# Patient Record
Sex: Female | Born: 2000
Health system: Southern US, Community
[De-identification: ages and names within clinical notes are randomized; demographics above are authoritative.]

## PROBLEM LIST (undated history)

## (undated) DIAGNOSIS — J45909 Unspecified asthma, uncomplicated: Secondary | ICD-10-CM

## (undated) DIAGNOSIS — O24419 Gestational diabetes mellitus in pregnancy, unspecified control: Secondary | ICD-10-CM

## (undated) HISTORY — DX: Gestational diabetes mellitus in pregnancy, unspecified control: O24.419

## (undated) HISTORY — DX: Unspecified asthma, uncomplicated: J45.909

## (undated) HISTORY — PX: THROAT SURGERY: SHX803

---
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
## 2020-03-28 LAB — OB RESULTS CONSOLE GC/CHLAMYDIA
Chlamydia: NEGATIVE
Gonorrhea: NEGATIVE

## 2020-05-02 LAB — OB RESULTS CONSOLE RPR: RPR: NONREACTIVE

## 2020-05-02 LAB — OB RESULTS CONSOLE HEPATITIS B SURFACE ANTIGEN: Hepatitis B Surface Ag: NEGATIVE

## 2020-05-02 LAB — OB RESULTS CONSOLE RUBELLA ANTIBODY, IGM: Rubella: NON-IMMUNE/NOT IMMUNE

## 2020-05-02 LAB — HEPATITIS C ANTIBODY: HCV Ab: NEGATIVE

## 2020-05-02 LAB — OB RESULTS CONSOLE HIV ANTIBODY (ROUTINE TESTING): HIV: NONREACTIVE

## 2020-06-04 ENCOUNTER — Other Ambulatory Visit: Payer: Self-pay | Admitting: Obstetrics and Gynecology

## 2020-06-04 DIAGNOSIS — Z363 Encounter for antenatal screening for malformations: Secondary | ICD-10-CM

## 2020-06-25 ENCOUNTER — Ambulatory Visit: Payer: Medicaid Other | Attending: Obstetrics and Gynecology

## 2020-06-25 ENCOUNTER — Other Ambulatory Visit: Payer: Self-pay

## 2020-06-25 DIAGNOSIS — Z363 Encounter for antenatal screening for malformations: Secondary | ICD-10-CM

## 2020-06-26 ENCOUNTER — Other Ambulatory Visit: Payer: Self-pay | Admitting: Obstetrics

## 2020-06-26 DIAGNOSIS — Z362 Encounter for other antenatal screening follow-up: Secondary | ICD-10-CM

## 2020-06-26 DIAGNOSIS — O99332 Smoking (tobacco) complicating pregnancy, second trimester: Secondary | ICD-10-CM

## 2020-07-19 ENCOUNTER — Encounter: Payer: Self-pay | Admitting: *Deleted

## 2020-07-23 ENCOUNTER — Ambulatory Visit: Payer: Medicaid Other | Admitting: *Deleted

## 2020-07-23 ENCOUNTER — Ambulatory Visit: Payer: Medicaid Other | Attending: Obstetrics

## 2020-07-23 ENCOUNTER — Encounter: Payer: Self-pay | Admitting: *Deleted

## 2020-07-23 ENCOUNTER — Other Ambulatory Visit: Payer: Self-pay

## 2020-07-23 VITALS — BP 117/63 | HR 92

## 2020-07-23 DIAGNOSIS — F17290 Nicotine dependence, other tobacco product, uncomplicated: Secondary | ICD-10-CM

## 2020-07-23 DIAGNOSIS — O99332 Smoking (tobacco) complicating pregnancy, second trimester: Secondary | ICD-10-CM | POA: Diagnosis present

## 2020-07-23 DIAGNOSIS — Z3A23 23 weeks gestation of pregnancy: Secondary | ICD-10-CM

## 2020-07-23 DIAGNOSIS — O99512 Diseases of the respiratory system complicating pregnancy, second trimester: Secondary | ICD-10-CM

## 2020-07-23 DIAGNOSIS — O321XX0 Maternal care for breech presentation, not applicable or unspecified: Secondary | ICD-10-CM

## 2020-07-23 DIAGNOSIS — Z362 Encounter for other antenatal screening follow-up: Secondary | ICD-10-CM | POA: Insufficient documentation

## 2020-07-23 DIAGNOSIS — J45909 Unspecified asthma, uncomplicated: Secondary | ICD-10-CM

## 2020-07-23 DIAGNOSIS — Z3400 Encounter for supervision of normal first pregnancy, unspecified trimester: Secondary | ICD-10-CM

## 2020-08-21 LAB — OB RESULTS CONSOLE RPR: RPR: NONREACTIVE

## 2020-09-12 ENCOUNTER — Encounter: Payer: Medicaid Other | Attending: Obstetrics and Gynecology | Admitting: Registered"

## 2020-09-12 ENCOUNTER — Encounter: Payer: Self-pay | Admitting: Registered"

## 2020-09-12 ENCOUNTER — Other Ambulatory Visit: Payer: Self-pay

## 2020-09-12 DIAGNOSIS — O24419 Gestational diabetes mellitus in pregnancy, unspecified control: Secondary | ICD-10-CM | POA: Diagnosis not present

## 2020-09-12 HISTORY — DX: Gestational diabetes mellitus in pregnancy, unspecified control: O24.419

## 2020-09-12 NOTE — Progress Notes (Signed)
Patient was seen on 09/12/20 for Gestational Diabetes self-management class at [facility]. The following learning objectives were met by the patient during this course:   States the definition of Gestational Diabetes  States why dietary management is important in controlling blood glucose  Describes the effects each nutrient has on blood glucose levels  Demonstrates ability to create a balanced meal plan  Demonstrates carbohydrate counting   States when to check blood glucose levels  Demonstrates proper blood glucose monitoring techniques  States the effect of stress and exercise on blood glucose levels  States the importance of limiting caffeine and abstaining from alcohol and smoking  Blood glucose monitor given: Patient has meter but is not checking blood sugar prior to class Blood glucose reading: 95 mg/dL  Patient instructed to monitor glucose levels: FBS: 60 - <95; 1 hour: <140; 2 hour: <120  Patient received handouts:  Nutrition Diabetes and Pregnancy, including carb counting list  Patient will be seen for follow-up as needed.

## 2020-10-24 LAB — OB RESULTS CONSOLE GBS: GBS: NEGATIVE

## 2020-10-30 ENCOUNTER — Telehealth (HOSPITAL_COMMUNITY): Payer: Self-pay | Admitting: *Deleted

## 2020-10-30 ENCOUNTER — Encounter (HOSPITAL_COMMUNITY): Payer: Self-pay | Admitting: *Deleted

## 2020-10-30 NOTE — Telephone Encounter (Signed)
Preadmission screen  

## 2020-10-31 DIAGNOSIS — O321XX0 Maternal care for breech presentation, not applicable or unspecified: Secondary | ICD-10-CM | POA: Insufficient documentation

## 2020-11-02 ENCOUNTER — Encounter (HOSPITAL_COMMUNITY): Payer: Self-pay | Admitting: Obstetrics & Gynecology

## 2020-11-02 ENCOUNTER — Observation Stay (HOSPITAL_COMMUNITY)
Admission: AD | Admit: 2020-11-02 | Discharge: 2020-11-02 | Disposition: A | Discharge status: Home / Self Care | Payer: Medicaid Other | Attending: Obstetrics & Gynecology | Admitting: Obstetrics & Gynecology

## 2020-11-02 ENCOUNTER — Encounter (HOSPITAL_COMMUNITY): Payer: Self-pay | Admitting: Anesthesiology

## 2020-11-02 ENCOUNTER — Observation Stay (HOSPITAL_COMMUNITY)
Admission: RE | Admit: 2020-11-02 | Discharge: 2020-11-02 | Disposition: A | Discharge status: Home / Self Care | Payer: Medicaid Other | Source: Ambulatory Visit | Attending: Obstetrics & Gynecology | Admitting: Obstetrics & Gynecology

## 2020-11-02 DIAGNOSIS — J45909 Unspecified asthma, uncomplicated: Secondary | ICD-10-CM | POA: Diagnosis not present

## 2020-11-02 DIAGNOSIS — Z20822 Contact with and (suspected) exposure to covid-19: Secondary | ICD-10-CM | POA: Insufficient documentation

## 2020-11-02 DIAGNOSIS — O321XX0 Maternal care for breech presentation, not applicable or unspecified: Secondary | ICD-10-CM | POA: Diagnosis present

## 2020-11-02 DIAGNOSIS — O24113 Pre-existing diabetes mellitus, type 2, in pregnancy, third trimester: Secondary | ICD-10-CM | POA: Insufficient documentation

## 2020-11-02 DIAGNOSIS — Z3A37 37 weeks gestation of pregnancy: Secondary | ICD-10-CM | POA: Diagnosis not present

## 2020-11-02 DIAGNOSIS — Z87891 Personal history of nicotine dependence: Secondary | ICD-10-CM | POA: Insufficient documentation

## 2020-11-02 DIAGNOSIS — O98513 Other viral diseases complicating pregnancy, third trimester: Secondary | ICD-10-CM | POA: Insufficient documentation

## 2020-11-02 DIAGNOSIS — Z7984 Long term (current) use of oral hypoglycemic drugs: Secondary | ICD-10-CM | POA: Insufficient documentation

## 2020-11-02 DIAGNOSIS — B069 Rubella without complication: Secondary | ICD-10-CM | POA: Diagnosis not present

## 2020-11-02 DIAGNOSIS — O321XX1 Maternal care for breech presentation, fetus 1: Principal | ICD-10-CM | POA: Insufficient documentation

## 2020-11-02 LAB — RESP PANEL BY RT-PCR (FLU A&B, COVID) ARPGX2
Influenza A by PCR: NEGATIVE
Influenza B by PCR: NEGATIVE
SARS Coronavirus 2 by RT PCR: NEGATIVE

## 2020-11-02 LAB — CBC
HCT: 35.3 % — ABNORMAL LOW (ref 36.0–46.0)
Hemoglobin: 11.8 g/dL — ABNORMAL LOW (ref 12.0–15.0)
MCH: 27.6 pg (ref 26.0–34.0)
MCHC: 33.4 g/dL (ref 30.0–36.0)
MCV: 82.5 fL (ref 80.0–100.0)
Platelets: 188 10*3/uL (ref 150–400)
RBC: 4.28 MIL/uL (ref 3.87–5.11)
RDW: 13.8 % (ref 11.5–15.5)
WBC: 9.7 10*3/uL (ref 4.0–10.5)
nRBC: 0 % (ref 0.0–0.2)

## 2020-11-02 LAB — TYPE AND SCREEN
ABO/RH(D): O POS
Antibody Screen: NEGATIVE

## 2020-11-02 MED ORDER — TERBUTALINE SULFATE 1 MG/ML IJ SOLN
0.2500 mg | Freq: Once | INTRAMUSCULAR | Status: AC
  Administered 2020-11-02: 0.25 mg via SUBCUTANEOUS

## 2020-11-02 MED ORDER — SOD CITRATE-CITRIC ACID 500-334 MG/5ML PO SOLN: 30.0000 mL | ORAL | Status: DC

## 2020-11-02 MED ORDER — TERBUTALINE SULFATE 1 MG/ML IJ SOLN
INTRAMUSCULAR | Status: AC
  Filled 2020-11-02: qty 1

## 2020-11-02 MED ORDER — LACTATED RINGERS IV SOLN: INTRAVENOUS | Status: DC

## 2020-11-02 NOTE — Procedures (Signed)
Crystal Harper 20 y.o. G1P0 at [redacted]w[redacted]d  On arrival, reactive NST obtained. The risks, benefits and alternatives for external cephalic version were discussed and patient was consented. She was given terbutaline for uterine relaxation. A bedside US was performed which showed breech presentation and adequate fluid. Using manual pressure, the fetus was manipulated in a forward roll fashion, it was not successful so backward roll was attempted. Fetal heart tones were checked intermittently during the procedure and noted to be reassuring. The version was not successful, baby still in breech presentation. Placed on the monitoring, reassuring fetal status.  Patient was discharged following reactive monitoring with plan for scheduled CS [redacted]w[redacted]d-[redacted]w[redacted]d.  Joby Richart K Taam-Akelman 11/02/20 3:58 PM

## 2020-11-02 NOTE — H&P (Signed)
Crystal Harper is a 20 y.o. female G1P0 at [redacted]w[redacted]d presenting for scheduled ECV due to breech. She reports no LOF, VB, Contractions. Normal FM.   Pregnancy c/b: Breech GDMA2 on gylburide. Last scan 6/30 EFW 30.6% Rubella nonimmune  OB History     Gravida  1   Para      Term      Preterm      AB      Living         SAB      IAB      Ectopic      Multiple      Live Births             Past Medical History:  Diagnosis Date   Asthma    Childhood   Gestational diabetes    Gestational diabetes mellitus (GDM), antepartum 09/12/2020   Past Surgical History:  Procedure Laterality Date   THROAT SURGERY     WISDOM TOOTH EXTRACTION     Family History: family history is not on file. Social History:  reports that she has quit smoking. She has never used smokeless tobacco. She reports previous alcohol use. She reports previous drug use.     Maternal Diabetes: Yes:  Diabetes Type:  Insulin/Medication controlled Genetic Screening: Normal Panorama low risk Maternal Ultrasounds/Referrals: Normal Fetal Ultrasounds or other Referrals:  Referred to Materal Fetal Medicine  for anatomy scan Maternal Substance Abuse:  No Significant Maternal Medications:  None Significant Maternal Lab Results:  Group B Strep negative Other Comments:   GDMA1, Rubella nonimmune  Review of Systems Per HPI Exam Physical Exam    Blood pressure 124/80, pulse 86, temperature 98.2 F (36.8 C), temperature source Oral, resp. rate 18, height 5\' 3"  (1.6 m), weight 85.7 kg, last menstrual period 02/13/2020. Resting comfortably Gravid abdomen Lower extremity trace edema, no evidence of DVT Fetal testing: FHR 120, Cat 1. Toco quiet.  Prenatal labs: Rubella: Nonimmune (01/12 0000) RPR: Nonreactive (05/03 0000)  HBsAg: Negative (01/12 0000)  HIV: Non-reactive (01/12 0000)  GBS: Negative/-- (07/06 0000)   Assessment/Plan: Crystal Harper is a 20 y.o. female G1P0 [redacted]w[redacted]d admitted for  scheduled ECV ECV: confirmed breech on admission. Discussed ECV, including risks/benefits/alternative of primary CS. Patient signed consent. Rh positive.   Crystal Harper 11/02/2020, 8:32 AM

## 2020-11-05 ENCOUNTER — Telehealth (HOSPITAL_COMMUNITY): Payer: Self-pay | Admitting: *Deleted

## 2020-11-05 NOTE — Patient Instructions (Signed)
Crystal Harper  11/05/2020   Your procedure is scheduled on:  11/14/2020  Arrive at 46 at Entrance C on [address] at [facility]  and [employer]. You are invited to use the FREE valet parking or use the Visitor's parking deck.  Pick up the phone at the desk and dial [phone].  Call this number if you have problems the morning of surgery: [phone]  Remember:   Do not eat food:(After Midnight) Desps de medianoche.  Do not drink clear liquids: (After Midnight) Desps de medianoche.  Take these medicines the morning of surgery with A SIP OF WATER:  none   Do not wear jewelry, make-up or nail polish.  Do not wear lotions, powders, or perfumes. Do not wear deodorant.  Do not shave 48 hours prior to surgery.  Do not bring valuables to the hospital.  [facility] is not   responsible for any belongings or valuables brought to the hospital.  Contacts, dentures or bridgework may not be worn into surgery.  Leave suitcase in the car. After surgery it may be brought to your room.  For patients admitted to the hospital, checkout time is 11:00 AM the day of              discharge.      Please read over the following fact sheets that you were given:     Preparing for Surgery

## 2020-11-05 NOTE — Telephone Encounter (Signed)
Preadmission screen  

## 2020-11-07 ENCOUNTER — Encounter (HOSPITAL_COMMUNITY): Payer: Self-pay

## 2020-11-07 NOTE — Discharge Summary (Signed)
Physician Discharge Summary  Patient ID: Crystal Harper MRN: [id number] DOB/AGE: [birth year] 20 y.o.  Admit date: 11/02/2020 Discharge date: 11/02/2020   Admission Diagnoses: breech, pregnancy at [redacted]w[redacted]d    Discharge Diagnoses: breech, pregnancy at [redacted]w[redacted]d    Discharged Condition: good   Hospital Course:  The patient was admitted through pre-op holding where her consent was reviewed and all questions were answered. External cephalic version was attempted, it was not successful and baby remained in the breech position. Fetal status was reassuring. Patient was discharged in stable condition.   Treatments: External cephalic version  Discharge Exam: Blood pressure 124/80, pulse 86, temperature 98.2 F (36.8 C), temperature source Oral, resp. rate 16, height 5\' 3"  (1.6 m), weight 85.7 kg, last menstrual period 02/13/2020. Resting comfortably   Disposition: Discharge disposition: 01-Home or Self Care        Allergies as of 11/02/2020   No Known Allergies      Medication List     ASK your doctor about these medications    PRENATAL VITAMINS PO Take by mouth.         Signed: Jonelle Sidle 11/07/2020, 9:17 AM

## 2020-11-08 ENCOUNTER — Encounter (HOSPITAL_COMMUNITY)
Admission: AD | Disposition: A | Discharge status: Home / Self Care | Payer: Self-pay | Source: Home / Self Care | Attending: Obstetrics & Gynecology

## 2020-11-08 ENCOUNTER — Encounter (HOSPITAL_COMMUNITY): Payer: Self-pay | Admitting: Obstetrics & Gynecology

## 2020-11-08 ENCOUNTER — Inpatient Hospital Stay (HOSPITAL_COMMUNITY)
Admission: AD | Admit: 2020-11-08 | Discharge: 2020-11-10 | DRG: 788 | Disposition: A | Discharge status: Home / Self Care | Payer: Medicaid Other | Attending: Obstetrics & Gynecology | Admitting: Obstetrics & Gynecology

## 2020-11-08 ENCOUNTER — Inpatient Hospital Stay (HOSPITAL_COMMUNITY): Payer: Medicaid Other | Admitting: Certified Registered Nurse Anesthetist

## 2020-11-08 ENCOUNTER — Other Ambulatory Visit: Payer: Self-pay

## 2020-11-08 DIAGNOSIS — Z3A38 38 weeks gestation of pregnancy: Secondary | ICD-10-CM | POA: Diagnosis not present

## 2020-11-08 DIAGNOSIS — O3413 Maternal care for benign tumor of corpus uteri, third trimester: Secondary | ICD-10-CM | POA: Diagnosis present

## 2020-11-08 DIAGNOSIS — O321XX0 Maternal care for breech presentation, not applicable or unspecified: Secondary | ICD-10-CM | POA: Diagnosis present

## 2020-11-08 DIAGNOSIS — O24425 Gestational diabetes mellitus in childbirth, controlled by oral hypoglycemic drugs: Secondary | ICD-10-CM | POA: Diagnosis present

## 2020-11-08 DIAGNOSIS — D259 Leiomyoma of uterus, unspecified: Secondary | ICD-10-CM | POA: Diagnosis present

## 2020-11-08 DIAGNOSIS — Z87891 Personal history of nicotine dependence: Secondary | ICD-10-CM

## 2020-11-08 DIAGNOSIS — O9081 Anemia of the puerperium: Secondary | ICD-10-CM | POA: Diagnosis not present

## 2020-11-08 DIAGNOSIS — O26893 Other specified pregnancy related conditions, third trimester: Secondary | ICD-10-CM | POA: Diagnosis present

## 2020-11-08 LAB — GLUCOSE, CAPILLARY: Glucose-Capillary: 106 mg/dL — ABNORMAL HIGH (ref 70–99)

## 2020-11-08 SURGERY — Surgical Case
Anesthesia: Regional | Wound class: Clean Contaminated

## 2020-11-08 MED ORDER — PROPOFOL 10 MG/ML IV BOLUS
INTRAVENOUS | Status: AC
  Filled 2020-11-08: qty 20

## 2020-11-08 MED ORDER — SOD CITRATE-CITRIC ACID 500-334 MG/5ML PO SOLN
ORAL | Status: AC
  Filled 2020-11-08: qty 30

## 2020-11-08 MED ORDER — KETOROLAC TROMETHAMINE 30 MG/ML IJ SOLN
30.0000 mg | Freq: Four times a day (QID) | INTRAMUSCULAR | Status: AC
  Administered 2020-11-08 – 2020-11-09 (×4): 30 mg via INTRAVENOUS
  Filled 2020-11-08 (×4): qty 1

## 2020-11-08 MED ORDER — LACTATED RINGERS IV SOLN: INTRAVENOUS | Status: DC

## 2020-11-08 MED ORDER — MIDAZOLAM HCL 2 MG/2ML IJ SOLN
INTRAMUSCULAR | Status: DC | PRN
  Administered 2020-11-08: 2 mg via INTRAVENOUS

## 2020-11-08 MED ORDER — HYDROMORPHONE HCL 1 MG/ML IJ SOLN
INTRAMUSCULAR | Status: AC
  Filled 2020-11-08: qty 0.5

## 2020-11-08 MED ORDER — FLEET ENEMA 7-19 GM/118ML RE ENEM: 1.0000 | ENEMA | Freq: Every day | RECTAL | Status: DC | PRN

## 2020-11-08 MED ORDER — DIPHENHYDRAMINE HCL 25 MG PO CAPS: 25.0000 mg | ORAL_CAPSULE | Freq: Four times a day (QID) | ORAL | Status: DC | PRN

## 2020-11-08 MED ORDER — SUCCINYLCHOLINE CHLORIDE 200 MG/10ML IV SOSY
PREFILLED_SYRINGE | INTRAVENOUS | Status: AC
  Filled 2020-11-08: qty 10

## 2020-11-08 MED ORDER — SUCCINYLCHOLINE CHLORIDE 20 MG/ML IJ SOLN
INTRAMUSCULAR | Status: DC | PRN
  Administered 2020-11-08: 120 mg via INTRAVENOUS

## 2020-11-08 MED ORDER — COCONUT OIL OIL: 1.0000 "application " | TOPICAL_OIL | Status: DC | PRN

## 2020-11-08 MED ORDER — ROCURONIUM BROMIDE 10 MG/ML (PF) SYRINGE
PREFILLED_SYRINGE | INTRAVENOUS | Status: AC
  Filled 2020-11-08: qty 10

## 2020-11-08 MED ORDER — SENNOSIDES-DOCUSATE SODIUM 8.6-50 MG PO TABS
2.0000 | ORAL_TABLET | ORAL | Status: DC
  Administered 2020-11-08 – 2020-11-09 (×2): 2 via ORAL
  Filled 2020-11-08 (×2): qty 2

## 2020-11-08 MED ORDER — IBUPROFEN 600 MG PO TABS
600.0000 mg | ORAL_TABLET | Freq: Four times a day (QID) | ORAL | Status: DC
  Administered 2020-11-09 – 2020-11-10 (×3): 600 mg via ORAL
  Filled 2020-11-08 (×3): qty 1

## 2020-11-08 MED ORDER — BISACODYL 10 MG RE SUPP: 10.0000 mg | Freq: Every day | RECTAL | Status: DC | PRN

## 2020-11-08 MED ORDER — ARTIFICIAL TEARS OPHTHALMIC OINT
TOPICAL_OINTMENT | OPHTHALMIC | Status: AC
  Filled 2020-11-08: qty 3.5

## 2020-11-08 MED ORDER — PROPOFOL 10 MG/ML IV BOLUS
INTRAVENOUS | Status: DC | PRN
  Administered 2020-11-08: 200 mg via INTRAVENOUS

## 2020-11-08 MED ORDER — OXYTOCIN-SODIUM CHLORIDE 30-0.9 UT/500ML-% IV SOLN
INTRAVENOUS | Status: DC | PRN
  Administered 2020-11-08: 1300 mL/h via INTRAVENOUS

## 2020-11-08 MED ORDER — MENTHOL 3 MG MT LOZG: 1.0000 | LOZENGE | OROMUCOSAL | Status: DC | PRN

## 2020-11-08 MED ORDER — HYDROMORPHONE HCL 1 MG/ML IJ SOLN
0.2500 mg | INTRAMUSCULAR | Status: DC | PRN
  Administered 2020-11-08: 0.5 mg via INTRAVENOUS

## 2020-11-08 MED ORDER — SODIUM CHLORIDE (PF) 0.9 % IJ SOLN
INTRAMUSCULAR | Status: AC
  Filled 2020-11-08: qty 10

## 2020-11-08 MED ORDER — PROMETHAZINE HCL 25 MG/ML IJ SOLN: 6.2500 mg | INTRAMUSCULAR | Status: DC | PRN

## 2020-11-08 MED ORDER — HYDROMORPHONE HCL 1 MG/ML IJ SOLN
0.2500 mg | INTRAMUSCULAR | Status: DC | PRN
  Administered 2020-11-08 (×3): 0.5 mg via INTRAVENOUS

## 2020-11-08 MED ORDER — ACETAMINOPHEN 500 MG PO TABS
1000.0000 mg | ORAL_TABLET | Freq: Four times a day (QID) | ORAL | Status: DC
  Administered 2020-11-08 – 2020-11-10 (×7): 1000 mg via ORAL
  Filled 2020-11-08 (×8): qty 2

## 2020-11-08 MED ORDER — FENTANYL CITRATE (PF) 100 MCG/2ML IJ SOLN
INTRAMUSCULAR | Status: DC | PRN
  Administered 2020-11-08: 250 ug via INTRAVENOUS

## 2020-11-08 MED ORDER — NEOSTIGMINE METHYLSULFATE 3 MG/3ML IV SOSY
PREFILLED_SYRINGE | INTRAVENOUS | Status: AC
  Filled 2020-11-08: qty 3

## 2020-11-08 MED ORDER — STERILE WATER FOR IRRIGATION IR SOLN
Status: DC | PRN
Start: 2020-11-08 — End: 2020-11-08
  Administered 2020-11-08: 1000 mL

## 2020-11-08 MED ORDER — PHENYLEPHRINE 40 MCG/ML (10ML) SYRINGE FOR IV PUSH (FOR BLOOD PRESSURE SUPPORT)
PREFILLED_SYRINGE | INTRAVENOUS | Status: AC
  Filled 2020-11-08: qty 10

## 2020-11-08 MED ORDER — MIDAZOLAM HCL 2 MG/2ML IJ SOLN
INTRAMUSCULAR | Status: AC
  Filled 2020-11-08: qty 2

## 2020-11-08 MED ORDER — CEFAZOLIN SODIUM-DEXTROSE 2-3 GM-%(50ML) IV SOLR
INTRAVENOUS | Status: DC | PRN
  Administered 2020-11-08: 2 g via INTRAVENOUS

## 2020-11-08 MED ORDER — PRENATAL MULTIVITAMIN CH
1.0000 | ORAL_TABLET | Freq: Every day | ORAL | Status: DC
  Administered 2020-11-08 – 2020-11-09 (×2): 1 via ORAL
  Filled 2020-11-08 (×2): qty 1

## 2020-11-08 MED ORDER — ZOLPIDEM TARTRATE 5 MG PO TABS: 5.0000 mg | ORAL_TABLET | Freq: Every evening | ORAL | Status: DC | PRN

## 2020-11-08 MED ORDER — DROPERIDOL 2.5 MG/ML IJ SOLN: 0.6250 mg | Freq: Once | INTRAMUSCULAR | Status: DC | PRN

## 2020-11-08 MED ORDER — OXYCODONE HCL 5 MG PO TABS: 5.0000 mg | ORAL_TABLET | Freq: Once | ORAL | Status: DC | PRN

## 2020-11-08 MED ORDER — DIBUCAINE (PERIANAL) 1 % EX OINT: 1.0000 "application " | TOPICAL_OINTMENT | CUTANEOUS | Status: DC | PRN

## 2020-11-08 MED ORDER — WITCH HAZEL-GLYCERIN EX PADS: 1.0000 "application " | MEDICATED_PAD | CUTANEOUS | Status: DC | PRN

## 2020-11-08 MED ORDER — DEXAMETHASONE SODIUM PHOSPHATE 10 MG/ML IJ SOLN
INTRAMUSCULAR | Status: AC
  Filled 2020-11-08: qty 1

## 2020-11-08 MED ORDER — ONDANSETRON HCL 4 MG/2ML IJ SOLN
INTRAMUSCULAR | Status: AC
  Filled 2020-11-08: qty 2

## 2020-11-08 MED ORDER — FENTANYL CITRATE (PF) 250 MCG/5ML IJ SOLN
INTRAMUSCULAR | Status: AC
  Filled 2020-11-08: qty 5

## 2020-11-08 MED ORDER — ONDANSETRON HCL 4 MG/2ML IJ SOLN
INTRAMUSCULAR | Status: DC | PRN
  Administered 2020-11-08: 4 mg via INTRAVENOUS

## 2020-11-08 MED ORDER — KETOROLAC TROMETHAMINE 30 MG/ML IJ SOLN: 30.0000 mg | Freq: Four times a day (QID) | INTRAMUSCULAR | Status: DC

## 2020-11-08 MED ORDER — ACETAMINOPHEN 10 MG/ML IV SOLN
INTRAVENOUS | Status: AC
  Filled 2020-11-08: qty 100

## 2020-11-08 MED ORDER — TRANEXAMIC ACID-NACL 1000-0.7 MG/100ML-% IV SOLN
INTRAVENOUS | Status: DC | PRN
  Administered 2020-11-08: 1000 mg via INTRAVENOUS

## 2020-11-08 MED ORDER — OXYTOCIN-SODIUM CHLORIDE 30-0.9 UT/500ML-% IV SOLN: 2.5000 [IU]/h | INTRAVENOUS | Status: AC

## 2020-11-08 MED ORDER — IBUPROFEN 600 MG PO TABS: 600.0000 mg | ORAL_TABLET | Freq: Four times a day (QID) | ORAL | Status: DC

## 2020-11-08 MED ORDER — ACETAMINOPHEN 10 MG/ML IV SOLN
INTRAVENOUS | Status: DC | PRN
  Administered 2020-11-08: 1000 mg via INTRAVENOUS

## 2020-11-08 MED ORDER — GLYCOPYRROLATE PF 0.2 MG/ML IJ SOSY
PREFILLED_SYRINGE | INTRAMUSCULAR | Status: AC
  Filled 2020-11-08: qty 1

## 2020-11-08 MED ORDER — DEXAMETHASONE SODIUM PHOSPHATE 10 MG/ML IJ SOLN
INTRAMUSCULAR | Status: DC | PRN
  Administered 2020-11-08: 10 mg via INTRAVENOUS

## 2020-11-08 MED ORDER — SIMETHICONE 80 MG PO CHEW: 80.0000 mg | CHEWABLE_TABLET | ORAL | Status: DC | PRN

## 2020-11-08 MED ORDER — SODIUM CHLORIDE 0.9 % IR SOLN
Status: DC | PRN
Start: 2020-11-08 — End: 2020-11-08
  Administered 2020-11-08: 1000 mL

## 2020-11-08 MED ORDER — OXYCODONE HCL 5 MG PO TABS
5.0000 mg | ORAL_TABLET | ORAL | Status: DC | PRN
  Administered 2020-11-09: 10 mg via ORAL
  Administered 2020-11-09: 5 mg via ORAL
  Administered 2020-11-09 – 2020-11-10 (×2): 10 mg via ORAL
  Filled 2020-11-08 (×3): qty 2
  Filled 2020-11-08: qty 1

## 2020-11-08 MED ORDER — SIMETHICONE 80 MG PO CHEW
80.0000 mg | CHEWABLE_TABLET | Freq: Three times a day (TID) | ORAL | Status: DC
  Administered 2020-11-08 – 2020-11-10 (×7): 80 mg via ORAL
  Filled 2020-11-08 (×7): qty 1

## 2020-11-08 MED ORDER — HYDROMORPHONE HCL 1 MG/ML IJ SOLN
0.2000 mg | INTRAMUSCULAR | Status: DC | PRN
  Administered 2020-11-08: 0.6 mg via INTRAVENOUS
  Filled 2020-11-08: qty 1

## 2020-11-08 MED ORDER — OXYTOCIN-SODIUM CHLORIDE 30-0.9 UT/500ML-% IV SOLN
INTRAVENOUS | Status: AC
  Filled 2020-11-08: qty 500

## 2020-11-08 MED ORDER — LACTATED RINGERS IV SOLN: INTRAVENOUS | Status: DC | PRN

## 2020-11-08 MED ORDER — ACETAMINOPHEN 10 MG/ML IV SOLN: 1000.0000 mg | Freq: Once | INTRAVENOUS | Status: DC | PRN

## 2020-11-08 MED ORDER — LIDOCAINE 2% (20 MG/ML) 5 ML SYRINGE
INTRAMUSCULAR | Status: AC
  Filled 2020-11-08: qty 5

## 2020-11-08 MED ORDER — KETOROLAC TROMETHAMINE 30 MG/ML IJ SOLN
30.0000 mg | Freq: Once | INTRAMUSCULAR | Status: AC | PRN
  Administered 2020-11-08: 30 mg via INTRAVENOUS

## 2020-11-08 MED ORDER — OXYCODONE HCL 5 MG/5ML PO SOLN: 5.0000 mg | Freq: Once | ORAL | Status: DC | PRN

## 2020-11-08 MED ORDER — KETOROLAC TROMETHAMINE 30 MG/ML IJ SOLN
INTRAMUSCULAR | Status: AC
  Filled 2020-11-08: qty 1

## 2020-11-08 MED ORDER — ONDANSETRON HCL 4 MG/2ML IJ SOLN: 4.0000 mg | Freq: Once | INTRAMUSCULAR | Status: DC | PRN

## 2020-11-08 SURGICAL SUPPLY — 34 items
BENZOIN TINCTURE PRP APPL 2/3 (GAUZE/BANDAGES/DRESSINGS) ×2 IMPLANT
CHLORAPREP W/TINT 26ML (MISCELLANEOUS) ×2 IMPLANT
CLAMP CORD UMBIL (MISCELLANEOUS) IMPLANT
CLOSURE STERI STRIP 1/2 X4 (GAUZE/BANDAGES/DRESSINGS) ×2 IMPLANT
CLOTH BEACON ORANGE TIMEOUT ST (SAFETY) ×2 IMPLANT
DRSG OPSITE POSTOP 4X10 (GAUZE/BANDAGES/DRESSINGS) ×2 IMPLANT
ELECT REM PT RETURN 9FT ADLT (ELECTROSURGICAL) ×2
ELECTRODE REM PT RTRN 9FT ADLT (ELECTROSURGICAL) ×1 IMPLANT
EXTRACTOR VACUUM KIWI (MISCELLANEOUS) IMPLANT
GLOVE BIO SURGEON STRL SZ 6 (GLOVE) ×2 IMPLANT
GLOVE BIOGEL PI IND STRL 6 (GLOVE) ×1 IMPLANT
GLOVE BIOGEL PI IND STRL 7.0 (GLOVE) ×1 IMPLANT
GLOVE BIOGEL PI INDICATOR 6 (GLOVE) ×1
GLOVE BIOGEL PI INDICATOR 7.0 (GLOVE) ×1
GOWN STRL REUS W/TWL LRG LVL3 (GOWN DISPOSABLE) ×4 IMPLANT
KIT ABG SYR 3ML LUER SLIP (SYRINGE) IMPLANT
NEEDLE HYPO 25X5/8 SAFETYGLIDE (NEEDLE) IMPLANT
NS IRRIG 1000ML POUR BTL (IV SOLUTION) ×2 IMPLANT
PACK C SECTION WH (CUSTOM PROCEDURE TRAY) ×2 IMPLANT
PAD OB MATERNITY 4.3X12.25 (PERSONAL CARE ITEMS) ×2 IMPLANT
PENCIL SMOKE EVAC W/HOLSTER (ELECTROSURGICAL) ×2 IMPLANT
RTRCTR C-SECT PINK 25CM LRG (MISCELLANEOUS) ×2 IMPLANT
STRIP CLOSURE SKIN 1/2X4 (GAUZE/BANDAGES/DRESSINGS) ×2 IMPLANT
SUT MNCRL 0 VIOLET CTX 36 (SUTURE) ×1 IMPLANT
SUT MNCRL AB 4-0 PS2 18 (SUTURE) ×2 IMPLANT
SUT MONOCRYL 0 CTX 36 (SUTURE) ×2
SUT PLAIN 0 NONE (SUTURE) IMPLANT
SUT VIC AB 0 CTX 36 (SUTURE) ×4
SUT VIC AB 0 CTX36XBRD ANBCTRL (SUTURE) ×2 IMPLANT
SUT VIC AB 2-0 CT1 27 (SUTURE) ×4
SUT VIC AB 2-0 CT1 TAPERPNT 27 (SUTURE) ×2 IMPLANT
TOWEL OR 17X24 6PK STRL BLUE (TOWEL DISPOSABLE) ×2 IMPLANT
TRAY FOLEY W/BAG SLVR 14FR LF (SET/KITS/TRAYS/PACK) ×2 IMPLANT
WATER STERILE IRR 1000ML POUR (IV SOLUTION) ×2 IMPLANT

## 2020-11-08 NOTE — MAU Note (Signed)
Leaking cl fld since 0430. Having ctxs. 2-3 last sve. Breech. Good FM

## 2020-11-08 NOTE — Op Note (Signed)
Crystal Harper PROCEDURE DATE: 11/08/2020  PREOPERATIVE DIAGNOSES: Intrauterine pregnancy at [redacted]w[redacted]d weeks gestation;  cord prolapse, breech presentation  POSTOPERATIVE DIAGNOSES: The same  PROCEDURE: Primary Low Transverse Cesarean Section  SURGEON:  Dr. Shellia Cleverly  ASSISTANT:  Dr. Corliss Blacker  ANESTHESIOLOGY TEAM: Anesthesiologist: Josephine Igo, MD CRNA: Sandrea Matte, CRNA; Hewitt Blade, CRNA; Ignacia Bayley, CRNA; Vista Deck, CRNA  ANESTHESIA: General  INTRAVENOUS FLUIDS: 1700 ml   ESTIMATED BLOOD LOSS: 307 ml URINE OUTPUT:  200 ml SPECIMENS: Placenta sent to pathology COMPLICATIONS: None immediate  Due to emergency situation, a full written consent process was not obtained. The risks, benefits, complications, treatment options, and expected outcomes were discussed with the patient while moving to the OR.   The patient concurred with the proposed plan, giving verbal informed consent.     FINDINGS:  Viable female infant in breech presentation.  Apgars 1, 3 and 5.  Clear amniotic fluid.  Intact placenta, three vessel cord.  Normal fallopian tubes and ovaries bilaterally. 4x4cm fibroid noted on uterine fundus.  PROCEDURE IN DETAIL:  The patient was urgently taken to the the operating room and general anesthesia was administered. She was then placed in a dorsal supine position with a leftward tilt, prepped quickly with betadine and draped in a sterile manner. After a timeout was performed, a Pfannenstiel skin incision was made with scalpel and carried through to the underlying layer of fascia. The fascial incision was extended bilaterally in a blunt fashion.  The fascia was separated from underlying rectus muscles bluntly.  The rectus muscles were separated in the midline bluntly and the peritoneum was entered bluntly. Attention was turned to the lower uterine segment where a low transverse hysterotomy was made with a scalpel and extended bilaterally  bluntly.  The infant was successfully delivered from breech presentation, the cord was clamped and cut and the infant was handed over to awaiting neonatology team immediately. Arterial blood gas sent. Incision to delivery time was less than two minutes.  The placenta was delivered intact and had a three-vessel cord. The uterus was then cleared of clot and debris.  The hysterotomy was closed with 0 Monocryl in a running locked fashion, and an imbricating layer was also placed with 0 Monocryl. The pelvis was cleared of all clot and debris. Hemostasis was confirmed on all surfaces. The fascia was then closed using 0 Vicryl in a running fashion.  The subcutaneous layer was irrigated and closed with plain gut and the skin was closed with a 4-0 Vicryl subcuticular stitch. The patient tolerated the procedure well. Sponge, instrument and needle counts were correct x 3.  She was taken to the recovery room in stable condition.   Arrie Senate, MD OB Fellow, [place] for [place] 11/08/2020 7:25 AM

## 2020-11-08 NOTE — Progress Notes (Signed)
Subjective: Postpartum Day 0: Cesarean Delivery, S/P stat C/S under general anesthesia for cord prolapse Patient reports pain controlled, denies nausea or vomiting.   Objective: Vital signs in last 24 hours: Temp:  [97 F (36.1 C)-97.9 F (36.6 C)] 97.7 F (36.5 C) (07/21 1515) Pulse Rate:  [62-89] 75 (07/21 1515) Resp:  [0-26] 16 (07/21 1515) BP: (114-133)/(68-92) 132/73 (07/21 1515) SpO2:  [95 %-100 %] 100 % (07/21 1515) Weight:  [85.3 kg] 85.3 kg (07/21 0618)  Physical Exam:  General: alert, cooperative, and appears stated age Lochia: appropriate Uterine Fundus: firm Incision: healing well DVT Evaluation: No evidence of DVT seen on physical exam.  No results for input(s): HGB, HCT in the last 72 hours.  Assessment/Plan: Status post Cesarean section. Doing well postoperatively.  Continue current care. Baby in [place] 11/08/2020, 4:59 PM

## 2020-11-08 NOTE — OB Triage Provider Note (Signed)
I was called to the room for patient who was breech, leaking fluid, and having painful contractions. As I entered the room, there was a deceleration on the FHR tracing and I put on a sterile glove to check the cervix.  Umbilical cord was visible at introitus prior to exam.  I pushed the presenting part off of the cord with my gloved hand and stated that we had a cord prolapse and needed to activate a Code Cesarean. Pt transported to OR and I remained on stretcher, with gloved hand holding presenting part off of cord as much as possible.  See OP report for delivery details.

## 2020-11-08 NOTE — Anesthesia Postprocedure Evaluation (Signed)
Anesthesia Post Note  Patient: Crystal Harper  Procedure(s) Performed: CESAREAN SECTION     Patient location during evaluation: PACU Level of consciousness: awake and alert Pain management: pain level controlled Vital Signs Assessment: post-procedure vital signs reviewed and stable Respiratory status: spontaneous breathing, nonlabored ventilation and respiratory function stable Cardiovascular status: blood pressure returned to baseline and stable Postop Assessment: no apparent nausea or vomiting Anesthetic complications: no   No notable events documented.  Last Vitals:  Vitals:   11/08/20 0830 11/08/20 0839  BP: (!) 118/92 126/83  Pulse: 68 63  Resp: 19 17  Temp:  (!) 36.1 C  SpO2:  98%    Last Pain:  Vitals:   11/08/20 0839  TempSrc: Axillary  PainSc:    Pain Goal: Patients Stated Pain Goal: 0 (11/08/20 7096)              Epidural/Spinal Function Cutaneous sensation: Normal sensation (11/08/20 0830), Patient able to flex knees: Yes (11/08/20 0830), Patient able to lift hips off bed: No (11/08/20 0830), Back pain beyond tenderness at insertion site: No (11/08/20 0830), Progressively worsening motor and/or sensory loss: No (11/08/20 0830), Bowel and/or bladder incontinence post epidural: No (11/08/20 0830)  Lidia Collum

## 2020-11-08 NOTE — Anesthesia Preprocedure Evaluation (Addendum)
Anesthesia Evaluation   Patient awake  Preop documentation limited or incomplete due to emergent nature of procedure.  History of Anesthesia Complications Negative for: history of anesthetic complications  Airway        Dental   Pulmonary former smoker,           Cardiovascular      Neuro/Psych    GI/Hepatic   Endo/Other  diabetes  Renal/GU      Musculoskeletal   Abdominal   Peds  Hematology        Component                Value               Date/Time                 HGB                      11.8 (L)            11/02/2020 0808           HGB                      14.2                12/05/2019 1102           HCT                      35.3 (L)            11/02/2020 0808           HCT                      40.5                12/05/2019 1102           PLT                      188                 11/02/2020 0808           PLT                      247                 12/05/2019 1102        Anesthesia Other Findings Cord prolapse- to OR STAT for C/S under GA  Reproductive/Obstetrics (+) Pregnancy G1P1001 at [redacted]w[redacted]d                            Anesthesia Physical Anesthesia Plan  ASA: 3 and emergent  Anesthesia Plan: General ETT, Rapid Sequence and Cricoid Pressure   Post-op Pain Management:    Induction:   PONV Risk Score and Plan: Treatment may vary due to age or medical condition, Ondansetron, Dexamethasone and Midazolam  Airway Management Planned:   Additional Equipment:   Intra-op Plan:   Post-operative Plan:   Informed Consent:     Only emergency history available  Plan Discussed with:   Anesthesia Plan Comments:         Anesthesia Quick Evaluation

## 2020-11-08 NOTE — H&P (Signed)
Crystal Harper is a 20 y.o. female G1P0 35w3dpresented to MAU for leakage of fluid, contractions.  Stat CS was called from the MAU due to prolapsed cord.   Pregnancy c/b: Breech GDMA2 on gylburide. Last scan 6/30 EFW 30.6% Rubella nonimmune  OB History     Gravida  1   Para      Term      Preterm      AB      Living         SAB      IAB      Ectopic      Multiple      Live Births             Past Medical History:  Diagnosis Date   Asthma    Childhood   Gestational diabetes    Gestational diabetes mellitus (GDM), antepartum 09/12/2020   Past Surgical History:  Procedure Laterality Date   THROAT SURGERY     WISDOM TOOTH EXTRACTION     Family History: family history is not on file. Social History:  reports that she has quit smoking. She has never used smokeless tobacco. She reports previous alcohol use. She reports previous drug use.     Maternal Diabetes: Yes:  Diabetes Type:  Insulin/Medication controlled Genetic Screening: Normal Panorama low risk Maternal Ultrasounds/Referrals: Normal Fetal Ultrasounds or other Referrals:  Referred to Materal Fetal Medicine  for anatomy scan Maternal Substance Abuse:  No Significant Maternal Medications:  None Significant Maternal Lab Results:  Group B Strep negative Other Comments:   GDMA1, Rubella nonimmune  Review of Systems Per HPI Exam Physical Exam    Blood pressure 131/74, pulse (P) 89, temperature 97.9 F (36.6 C), resp. rate (P) 18, height _0  (1.6 m), weight 85.3 kg, last menstrual period 02/13/2020, SpO2 (P) 100 %. Per MAU, unable to examine patient prior to OR Prenatal labs: ABO, Rh:  --/--/O POS (07/15 02023 Antibody: NEG (07/15 03435 Rubella: Nonimmune (01/12 0000) RPR: Nonreactive (05/03 0000)  HBsAg: Negative (01/12 0000)  HIV: Non-reactive (01/12 0000)  GBS: Negative/-- (07/06 0000)   Chemistry Hematology Recent Labs  Lab 11/02/20 0808  WBC 9.7  RBC 4.28  HGB 11.8*   HCT 35.3*  MCV 82.5  MCH 27.6  MCHC 33.4  RDW 13.8  PLT 188     Assessment/Plan: Crystal Harper is a 20y.o. female G1P0 34w3ddmitted for stat CS due to cord prolapse insetting of SROM/labor, breech.  Stat CS: Initial care by MAU, I was notified about patient and CS after baby was delivered by Dr. ErRip Harbournd Dr. FiSylvester HarderSee op note for details GDMA2: was on gylburide, plan fasting PPD#1 BG Rubella nonimmune: MMR PP  Crystal Harper 11/08/2020, 7:42 AM

## 2020-11-08 NOTE — Transfer of Care (Signed)
Immediate Anesthesia Transfer of Care Note  Patient: Crystal Harper  Procedure(s) Performed: CESAREAN SECTION  Patient Location: PACU  Anesthesia Type:General  Level of Consciousness: awake and patient cooperative  Airway & Oxygen Therapy: Patient Spontanous Breathing  Post-op Assessment: Report given to RN and Post -op Vital signs reviewed and stable  Post vital signs: Reviewed and stable  Last Vitals:  Vitals Value Taken Time  BP 131/74 11/08/20 0732  Temp    Pulse 83 11/08/20 0739  Resp 18 11/08/20 0739  SpO2 100 % 11/08/20 0739  Vitals shown include unvalidated device data.  Last Pain:  Vitals:   11/08/20 0618  PainSc: 9       Patients Stated Pain Goal: 0 (88/91/69 4503)  Complications: No notable events documented.

## 2020-11-08 NOTE — Anesthesia Procedure Notes (Addendum)
Procedure Name: Intubation Date/Time: 11/08/2020 6:58 AM Performed by: Sandrea Matte, CRNA Pre-anesthesia Checklist: Patient identified, Emergency Drugs available, Suction available and Patient being monitored Patient Re-evaluated:Patient Re-evaluated prior to induction Oxygen Delivery Method: Circle system utilized Preoxygenation: Pre-oxygenation with 100% oxygen Induction Type: IV induction, Rapid sequence and Cricoid Pressure applied Ventilation: Mask ventilation without difficulty Laryngoscope Size: Mac and 4 Grade View: Grade I Tube type: Oral Tube size: 7.0 mm Number of attempts: 1 Airway Equipment and Method: Stylet and Oral airway Placement Confirmation: ETT inserted through vocal cords under direct vision, positive ETCO2 and breath sounds checked- equal and bilateral Secured at: 21 cm Tube secured with: Tape Dental Injury: Teeth and Oropharynx as per pre-operative assessment  Comments: Intubated by Dr. Royce Macadamia

## 2020-11-08 NOTE — Lactation Note (Signed)
This note was copied from a baby's chart. Lactation Consultation Note  Patient Name: Crystal Harper [id number] Date: 11/08/2020 Reason for consult: Initial assessment;Primapara;1st time breastfeeding;NICU baby;Early term 37-38.6wks Age:20 years  I conducted an initial lactation consult with Ms. Crystal Harper. I assisted with initiating her first breast pumping session. I recommended that she pump both breasts on the initiate setting 8 times a day, or approximately every three hours. We did not note milk in the flange at this session. I provided reassurance that this was normal.  I reviewed some points from the NICU Injoy guide.   Ms. Crystal Harper had some questions about baby "Crystal Harper's" status. I recommended that she speak with her pediatrician when they follow up with her.  Ms. Crystal Harper has Crystal Harper and needs to call her provider regarding a breast pump. I made her aware of the stork pump option, and she plans to think it over and make a decision later.  Maternal Data Does the patient have breastfeeding experience prior to this delivery?: No   Lactation Tools Discussed/Used Tools: Pump Breast pump type: Double-Electric Breast Pump Pump Education: Setup, frequency, and cleaning Reason for Pumping: NICU; separation Pumping frequency: recommended q3 hours Pumped volume: 0 mL  Interventions Interventions: Breast feeding basics reviewed;Education;DEBP  Discharge Pump: DEBP;Advised to call insurance company (is considering a stork pump)  Consult Status Consult Status: Follow-up Follow-up type: In-patient    Lenore Manner 11/08/2020, 11:30 AM

## 2020-11-09 LAB — GLUCOSE, CAPILLARY: Glucose-Capillary: 85 mg/dL (ref 70–99)

## 2020-11-09 LAB — CBC
HCT: 26.6 % — ABNORMAL LOW (ref 36.0–46.0)
Hemoglobin: 8.8 g/dL — ABNORMAL LOW (ref 12.0–15.0)
MCH: 27.2 pg (ref 26.0–34.0)
MCHC: 33.1 g/dL (ref 30.0–36.0)
MCV: 82.1 fL (ref 80.0–100.0)
Platelets: 166 10*3/uL (ref 150–400)
RBC: 3.24 MIL/uL — ABNORMAL LOW (ref 3.87–5.11)
RDW: 14 % (ref 11.5–15.5)
WBC: 15.5 10*3/uL — ABNORMAL HIGH (ref 4.0–10.5)
nRBC: 0 % (ref 0.0–0.2)

## 2020-11-09 MED ORDER — FERROUS SULFATE 325 (65 FE) MG PO TABS
325.0000 mg | ORAL_TABLET | Freq: Every day | ORAL | Status: DC
  Administered 2020-11-10: 325 mg via ORAL
  Filled 2020-11-09 (×2): qty 1

## 2020-11-09 NOTE — Progress Notes (Signed)
Patient screened out for psychosocial assessment since none of the following apply:  Psychosocial stressors documented in mother or baby's chart  Gestation less than 32 weeks  Code at delivery   Infant with anomalies Please contact the Clinical Social Worker if specific needs arise, by MOB's request, or if MOB scores greater than 9/yes to question 10 on Edinburgh Postpartum Depression Screen.  Brittini Brubeck, LCSW Clinical Social Worker [facility] Cell#: [phone]     

## 2020-11-09 NOTE — Progress Notes (Signed)
Patient is eating, ambulating, voiding.  Pain control is good.  Appropriate lochia, no complaints.  Vitals:   11/08/20 1952 11/08/20 2348 11/09/20 0326 11/09/20 0731  BP: (!) 105/53 117/68 111/67 118/69  Pulse: 67 70 77 69  Resp: '20 18 18 18  '$ Temp: 98.3 F (36.8 C) 97.8 F (36.6 C) 98.3 F (36.8 C) 97.6 F (36.4 C)  TempSrc: Oral Oral Oral Oral  SpO2: 99% 100% 97%   Weight:      Height:        Fundus firm, abd soft, nontender Inc: c/d/I Ext: no calf tenderness  Lab Results  Component Value Date   WBC 15.5 (H) 11/09/2020   HGB 8.8 (L) 11/09/2020   HCT 26.6 (L) 11/09/2020   MCV 82.1 11/09/2020   PLT 166 11/09/2020    --/--/O POS (07/15 [id number])  A/P Post op day #1 s/p STAT C/S for SROM/prolapse cord/breech presentation Baby in NICU, no breathing without supplement GDMA2, last blood sugar 106 Mom doing well Mild post-operative anemia, add FeSO4 daily with PNV  Routine care.   Crystal Harper

## 2020-11-09 NOTE — Lactation Note (Signed)
This note was copied from a baby's chart. Lactation Consultation Note  Patient Name: [name] [id number] Date: 11/09/2020 Reason for consult: Follow-up assessment Age:20 hours  1015 - I followed up with [name] in the NICU where she was observing baby Adriel. I provided a NICU Injoy booklet in Spanish for her husband. I reviewed pumping education and recommended that she pump q3 hours for 15 minutes.   [name] is interested in a stork pump. I initiated the paperwork and left the form in her room for her provider to sign. I notified her RN on OB floor and asked her to help with faxing the form once it's signed. I left a voicemail for Andree Coss with [facility] alerting him to the pump request.  Maternal Data Does the patient have breastfeeding experience prior to this delivery?: No   Lactation Tools Discussed/Used Tools: Pump Breast pump type: Double-Electric Breast Pump Pump Education: Setup, frequency, and cleaning Reason for Pumping: NICU Pumping frequency: q3-4 hours; recommended q3 Pumped volume:  (a few droplets last session)  Interventions Interventions: Education  Discharge Pump: DEBP;Stork Pump  Consult Status Consult Status: Follow-up Follow-up type: In-patient    Lenore Manner 11/09/2020, 12:08 PM

## 2020-11-10 MED ORDER — OXYCODONE HCL 5 MG PO TABS: 5.0000 mg | ORAL_TABLET | ORAL | 0 refills | Status: AC | PRN

## 2020-11-10 NOTE — Discharge Summary (Signed)
Postpartum Discharge Summary    Patient Name: Crystal Harper DOB: 01-01-2001 MRN: [id number]  Date of admission: 11/08/2020 Delivery date:11/08/2020  Delivering provider: Chancy Milroy  Date of discharge: 11/10/2020  Admitting diagnosis: Breech presentation [O32.1XX0] Intrauterine pregnancy: [redacted]w[redacted]d    Secondary diagnosis:  Active Problems:   Breech presentation  Additional problems: SROM/prolapsed cord, GDMA2, rubella non-immune, anemia    Discharge diagnosis: Term Pregnancy Delivered, Type 2 DM, and Anemia                                              Post partum procedures: none Augmentation: N/A Complications: None  Hospital course: Onset of Labor With Unplanned C/S   20y.o. yo G1P1001 at 371w3das admitted in Active Labor on 11/08/2020. Patient had a labor course significant for SROM/prolapsed cord. The patient went for cesarean section due to Cord Prolapse. Delivery details as follows: Membrane Rupture Time/Date: 4:30 AM ,11/08/2020   Delivery Method:C-Section, Low Transverse  Details of operation can be found in separate operative note. Patient had an uncomplicated postpartum course.  She is ambulating,tolerating a regular diet, passing flatus, and urinating well.  Patient is discharged home in stable condition 11/10/20.  Newborn Data: Birth date:11/08/2020  Birth time:6:50 AM  Gender:Female  Living status:Living  Apgars:1 ,3  Weight:3320 g   Magnesium Sulfate received: No BMZ received: No Rhophylac:N/A MMR:N/A T-DaP: see peds note Flu: N/A Transfusion:No  Physical exam  Vitals:   11/09/20 1514 11/09/20 2008 11/10/20 0429 11/10/20 0724  BP: 121/61 128/75 (!) 114/57 130/75  Pulse: 91 95 87 90  Resp: '18 17 16 18  ' Temp: 98.2 F (36.8 C) 98.2 F (36.8 C) 97.9 F (36.6 C) 98 F (36.7 C)  TempSrc: Oral Oral Oral Oral  SpO2: 98% 100% 100% 93%  Weight:      Height:       General: alert, cooperative, and no distress Lochia: appropriate Uterine Fundus:  firm Incision: Healing well with no significant drainage, No significant erythema, Dressing is clean, dry, and intact DVT Evaluation: No evidence of DVT seen on physical exam. Labs: Lab Results  Component Value Date   WBC 15.5 (H) 11/09/2020   HGB 8.8 (L) 11/09/2020   HCT 26.6 (L) 11/09/2020   MCV 82.1 11/09/2020   PLT 166 11/09/2020   CMP Latest Ref Rng & Units 12/05/2019  Glucose 65 - 99 mg/dL 88  BUN 6 - 20 mg/dL 10  Creatinine 0.57 - 1.00 mg/dL 0.59  Sodium 134 - 144 mmol/L 139  Potassium 3.5 - 5.2 mmol/L 4.3  Chloride 96 - 106 mmol/L 102  CO2 20 - 29 mmol/L 22  Calcium 8.7 - 10.2 mg/dL 9.8   Edinburgh Score: No flowsheet data found.    After visit meds:  Allergies as of 11/10/2020   No Known Allergies      Medication List     STOP taking these medications    glyBURIDE 2.5 MG tablet Commonly known as: DIABETA       TAKE these medications    oxyCODONE 5 MG immediate release tablet Commonly known as: Oxy IR/ROXICODONE Take 1-2 tablets (5-10 mg total) by mouth every 4 (four) hours as needed for moderate pain.   PRENATAL VITAMINS PO Take 1 tablet by mouth daily after lunch.  Discharge Care Instructions  (From admission, onward)           Start     Ordered   11/10/20 0000  Discharge wound care:       Comments: Remove dressing and steri strips 7 days after the date of your surgery.   11/10/20 0911             Discharge home in stable condition Infant Feeding:  see peds note Infant Disposition:NICU Discharge instruction: per After Visit Summary and Postpartum booklet. Activity: Advance as tolerated. Pelvic rest for 6 weeks.  Diet: routine diet Anticipated Birth Control: Unsure Postpartum Appointment:4 weeks Additional Postpartum F/U: Postpartum Depression checkup Future Appointments:No future appointments. Follow up Visit:      11/10/2020 Allyn Kenner, DO

## 2020-11-10 NOTE — Progress Notes (Signed)
Reviewed discharge postpartum instructions with patient regarding medications, when to call MD/go to MAU, signs and symptoms of pre-e, C-section site care, when to remove honeycomb/steristrips, and to follow up with OB for postpartum check.Pt verbalized understanding of discharge postpartum instructions and asked appropriate questions.

## 2020-11-10 NOTE — Plan of Care (Signed)

## 2020-11-10 NOTE — Lactation Note (Signed)
This note was copied from a baby's chart. Lactation Consultation Note  Patient Name: Crystal Harper [id number] Date: 11/10/2020 Reason for consult: Follow-up assessment;Primapara;1st time breastfeeding;NICU baby Age:20 hours  I followed up with Crystal Harper to assist with breast feeding. The RN assisted with moving baby from bassinet to mother's arms. We positioned him in football hold on the right breast. I helped Crystal Harper express transitional milk. Baby licked and briefly opened mouth to hold nipple in mother. No suckling noted.  Baby received feeding via feeding tube while we attempted.  I contacted Andree Coss with [employer] to work on application for a stork pump. Approval is pending.  Plan: Continue to pump. I recommended pumping q3 hours. I showed how to move to the expression phase of the pump. Offer baby the breast at feeding times. Continue to monitor for rooting and other feeding cues.   Maternal Data Has patient been taught Hand Expression?: Yes Does the patient have breastfeeding experience prior to this delivery?: No  Feeding Mother's Current Feeding Choice: Breast Milk and Formula  LATCH Score Latch: Too sleepy or reluctant, no latch achieved, no sucking elicited.  Audible Swallowing: None  Type of Nipple: Everted at rest and after stimulation  Comfort (Breast/Nipple): Soft / non-tender  Hold (Positioning): Assistance needed to correctly position infant at breast and maintain latch.  LATCH Score: 5   Lactation Tools Discussed/Used Breast pump type: Double-Electric Breast Pump;Other (comment) (ordering a stork pump) Pump Education: Setup, frequency, and cleaning;Milk Storage Reason for Pumping: NICU Pumping frequency: 6 times a day; recommended q3 hours Pumped volume: 15 mL  Interventions Interventions: Breast feeding basics reviewed;Assisted with latch;Skin to skin;Hand express;Support pillows;Education  Discharge Pump: DEBP (refer to stork  pump)  Consult Status Consult Status: Follow-up Follow-up type: In-patient    Lenore Manner 11/10/2020, 12:42 PM

## 2020-11-11 ENCOUNTER — Ambulatory Visit: Payer: Self-pay

## 2020-11-11 NOTE — Lactation Note (Signed)
This note was copied from a baby's chart. Lactation Consultation Note  Patient Name: Crystal Harper [id number] Date: 11/11/2020 Reason for consult: Follow-up assessment;NICU baby;Early term 37-38.6wks;Primapara;1st time breastfeeding Age:20 years  Visited with mom of 61 hours old ETI female, she's a P1 and already attempting taking baby to breast. When LC arrived in baby's room he had already been given a bottle, re-schedule feeding assist for tomorrow either at 12 or 3 pm.  Per mom's her supply is increasing, reviewed the importance of consistent pumping for the onset of lactogenesis II. She also voiced her nipples were getting sore, reviewed breast care and advised on adding coconut oil to her pumping sessions. LC to observed the next pumping sessions and check on flange sizes.  Plan of care:  Encouraged mom to continue pumping at least 8 times/24 hours She'll contact the [facility] office to get her pump, referral was sent today. Stork pump was not approved  FOB present and supportive; he only speaks [place], this consult took place in Spanish per mom's request. All questions and concerns answered, LC will F/U tomorrow.  Maternal Data   Mom's milk volume is WNL  Feeding Mother's Current Feeding Choice: Breast Milk and Formula Nipple Type: Dr. Myra Gianotti Preemie  Lactation Tools Discussed/Used Tools: Pump Breast pump type: Double-Electric Breast Pump Pump Education: Setup, frequency, and cleaning;Milk Storage Reason for Pumping: NICU infant Pumping frequency: 8 times/24 hours Pumped volume: 25 mL  Interventions Interventions: Breast feeding basics reviewed;DEBP;Education;Coconut oil  Discharge Discharge Education: Engorgement and breast care Pump: DEBP WIC Program: Yes  Consult Status Consult Status: Follow-up Follow-up type: In-patient    Crystal Harper 11/11/2020, 6:20 PM

## 2020-11-12 ENCOUNTER — Other Ambulatory Visit (HOSPITAL_COMMUNITY): Payer: Medicaid Other

## 2020-11-12 ENCOUNTER — Ambulatory Visit: Payer: Self-pay

## 2020-11-12 ENCOUNTER — Encounter (HOSPITAL_COMMUNITY)
Admission: RE | Admit: 2020-11-12 | Discharge: 2020-11-12 | Disposition: A | Discharge status: Home / Self Care | Payer: Medicaid Other | Source: Ambulatory Visit | Attending: Obstetrics & Gynecology | Admitting: Obstetrics & Gynecology

## 2020-11-12 LAB — SURGICAL PATHOLOGY

## 2020-11-12 LAB — GLUCOSE, CAPILLARY: Glucose-Capillary: 91 mg/dL (ref 70–99)

## 2020-11-12 NOTE — Lactation Note (Signed)
This note was copied from a baby's chart. Lactation Consultation Note  Patient Name: Crystal Harper [id number] Date: 11/12/2020 Reason for consult: Follow-up assessment;NICU baby;Primapara;1st time breastfeeding;Mother's request;Early term 25-38.6wks Age:20 days  Visited with mom of 17 days old ETI female for feeding assist. Baby already nursing when entered the room, mom reported he latched with ease to the right breast in football position.  Baby had predominantly a nutritive sucking pattern (with breast compressions) during the last 15 minutes when LC observed this feeding. Mom reported no pain/discomfort, breast felt soft upon examination. Baby fed for a total of 25-30 minutes. Reviewed normal ETI behavior, feeding cues and cluster feeding. LC showed FOB how to burp.  Plan of care:  Encouraged mom to start putting baby to breast ad lib  Pumping after feedings is still encouraged; mom understands the importance of emptying the breast  FOB present and supportive; he only speaks [place]. All questions and concerns answered, mom to call PRN.  Maternal Data    Feeding Mother's Current Feeding Choice: Breast Milk and Formula  LATCH Score Latch: Grasps breast easily, tongue down, lips flanged, rhythmical sucking.  Audible Swallowing: A few with stimulation (with compressions)  Type of Nipple: Everted at rest and after stimulation  Comfort (Breast/Nipple): Soft / non-tender  Hold (Positioning): Assistance needed to correctly position infant at breast and maintain latch.  LATCH Score: 8   Lactation Tools Discussed/Used Tools: Pump Breast pump type: Double-Electric Breast Pump Pump Education: Setup, frequency, and cleaning;Milk Storage Reason for Pumping: ETI NICU infant Pumping frequency: 7-8 times/24 hours Pumped volume: 30 mL  Interventions Interventions: Breast feeding basics reviewed;Assisted with latch;Skin to skin;Breast massage;Breast compression;Adjust  position;Support pillows;DEBP  Discharge Discharge Education: Engorgement and breast care Pump: DEBP  Consult Status Consult Status: Follow-up Follow-up type: In-patient    Crystal Harper 11/12/2020, 3:22 PM

## 2020-11-12 NOTE — Lactation Note (Signed)
This note was copied from a baby's chart. Lactation Consultation Note  Patient Name: Crystal Harper [id number] Date: 11/12/2020 Reason for consult: Follow-up assessment;NICU baby;Primapara;1st time breastfeeding;Early term 37-38.6wks Age:20 days  Visited with mom of 4 days olf ETI NICU female, she's a P1 and her milk is slowly increasing. LC came at noon for feeding assist, but mom voiced that she had already put baby to breast around 11 am and he was done with his feeding.  NICU RN reported to [facility] that this baby was just put on "ad lib" feedings, mom will be calling out for assistance again the next time baby is cueing and ready to go to breast.   Plan of care:  Encouraged mom to continue pumping at least 8 times/24 hours She'll start putting baby to breast on feeding cues and call for assistance PRN  FOB present and supportive; he only speaks [place]. This consult took place in Spanish per mom's request. All questions and concerns answered, LC will F/U on the next feeding.  Maternal Data   Milk supply increasing but slightly BNL  Feeding Mother's Current Feeding Choice: Breast Milk and Formula  LATCH Score Latch: Repeated attempts needed to sustain latch, nipple held in mouth throughout feeding, stimulation needed to elicit sucking reflex.  Audible Swallowing: Spontaneous and intermittent  Type of Nipple: Everted at rest and after stimulation  Comfort (Breast/Nipple): Soft / non-tender  Hold (Positioning): Assistance needed to correctly position infant at breast and maintain latch.  LATCH Score: 8   Lactation Tools Discussed/Used Tools: Pump Breast pump type: Double-Electric Breast Pump Pump Education: Setup, frequency, and cleaning;Milk Storage Reason for Pumping: ETI NICU infant Pumping frequency: 7-8 times/24 hours Pumped volume: 30 mL  Interventions Interventions: Breast feeding basics reviewed;Education;DEBP  Discharge Discharge Education: Engorgement  and breast care Pump: DEBP  Consult Status Consult Status: Follow-up Follow-up type: In-patient    Estefan Pattison Francene Boyers 11/12/2020, 1:42 PM

## 2020-11-13 ENCOUNTER — Ambulatory Visit: Payer: Self-pay

## 2020-11-13 NOTE — Lactation Note (Signed)
This note was copied from a Crystal's chart. Lactation Consultation Note  Patient Name: Crystal Harper [id number] Date: 11/13/2020 Reason for consult: Follow-up assessment;Primapara;1st time breastfeeding;NICU Crystal (discharge) Age:20 days  I conducted a discharge visit for Crystal Harper. She reports that Crystal Harper fed on both breasts around 1100. She states that she's "getting the hang" of breast feeding and plans to exclusively breast feed at home.  Crystal Harper has a manual pump. I advised that she call [facility] today about a DEBP, as she is still interested in obtaining one. I also offered to place a follow up call.   I provided discharge education including signs that Crystal is getting enough to each and community breast feeding resources. I recommended a follow up OP lactation appointment for reassurance purposes, and she consented to a referral.  Plan: Breast feed on demand 8-12 times a day. Watch Crystal's feeding cues, including quiet alert state.  Offer both breasts in a feeding. Monitor output (feeding record provided). Follow up with pediatrician within 48 hours of discharge and plan for lactation follow up support, as needed.  Feeding Mother's Current Feeding Choice: Breast Milk  LATCH Score Latch: Grasps breast easily, tongue down, lips flanged, rhythmical sucking.  Audible Swallowing: Spontaneous and intermittent  Type of Nipple: Everted at rest and after stimulation  Comfort (Breast/Nipple): Soft / non-tender  Hold (Positioning): No assistance needed to correctly position infant at breast.  LATCH Score: 10   Lactation Tools Discussed/Used Breast pump type: Double-Electric Breast Pump Pump Education: Milk Storage Reason for Pumping: Crystal was in the NICU Pumping frequency: PRN  Interventions Interventions: Breast feeding basics reviewed;Education  Discharge Discharge Education: Warning signs for feeding Crystal;Outpatient Epic message sent;Outpatient  recommendation  Consult Status Consult Status: Complete Follow-up type: Other (comment) (recommended OP)    Lenore Manner 11/13/2020, 12:26 PM

## 2020-11-14 ENCOUNTER — Inpatient Hospital Stay (HOSPITAL_COMMUNITY)
Admission: RE | Admit: 2020-11-14 | Payer: Medicaid Other | Source: Home / Self Care | Admitting: Obstetrics & Gynecology

## 2020-11-20 ENCOUNTER — Telehealth (HOSPITAL_COMMUNITY): Payer: Self-pay | Admitting: *Deleted

## 2020-11-20 NOTE — Telephone Encounter (Signed)
Attempted Hospital Discharge Follow-Up Phone Call.  Left voice mail requesting that patient return RN's phone call.

## 2023-01-10 IMAGING — US US MFM OB COMP +14 WKS
1 series · 13 of 28 positions shown · non-contrast
Comparison: none

[Series 1: us mfm ob comp +14 wks · 13 of 113 slices shown]
[im 5/113]
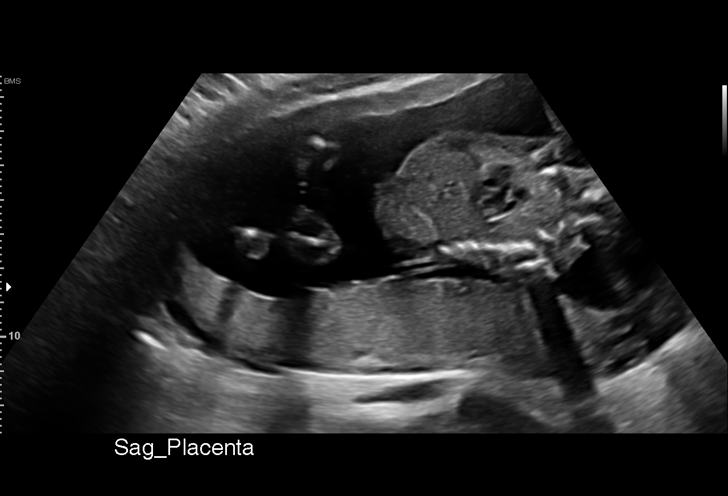
[im 13/113]
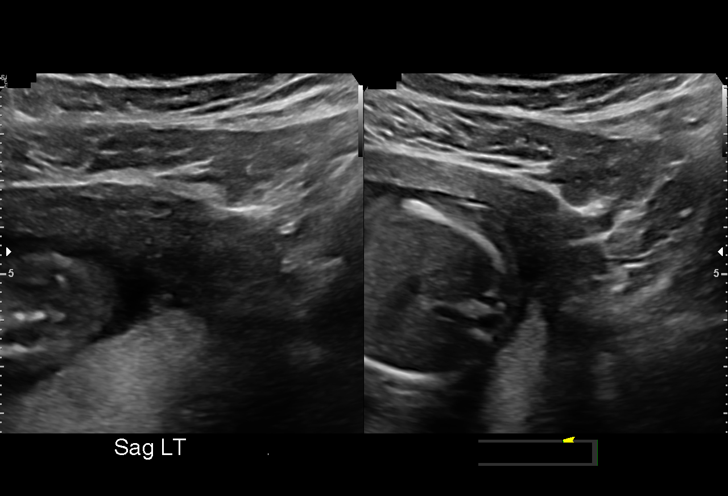
[im 21/113]
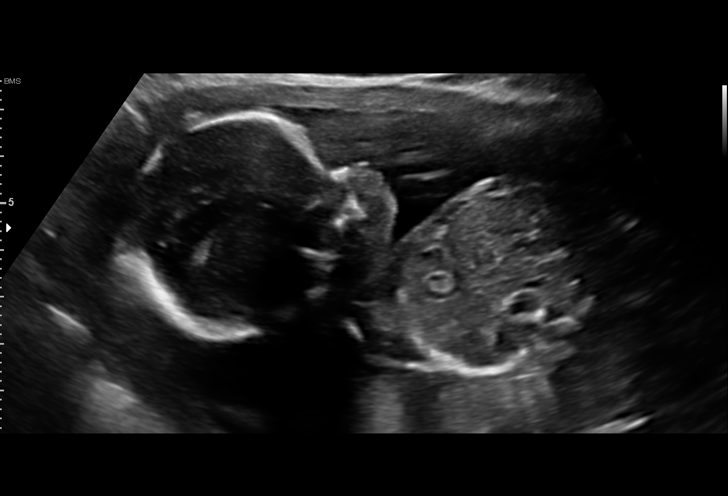
[im 30/113]
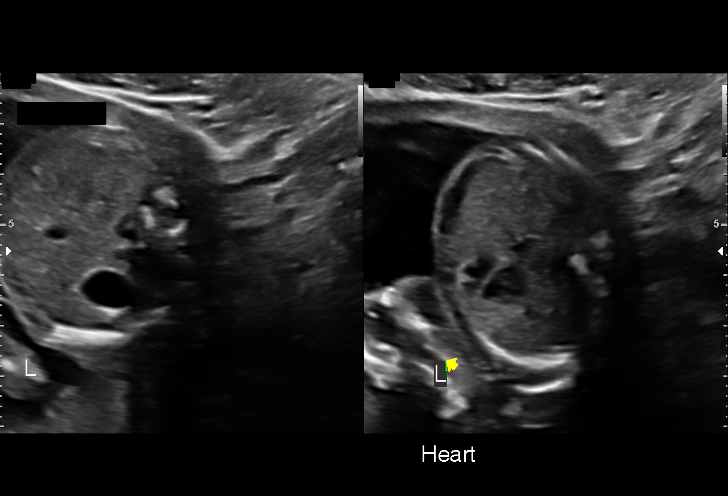
[im 38/113]
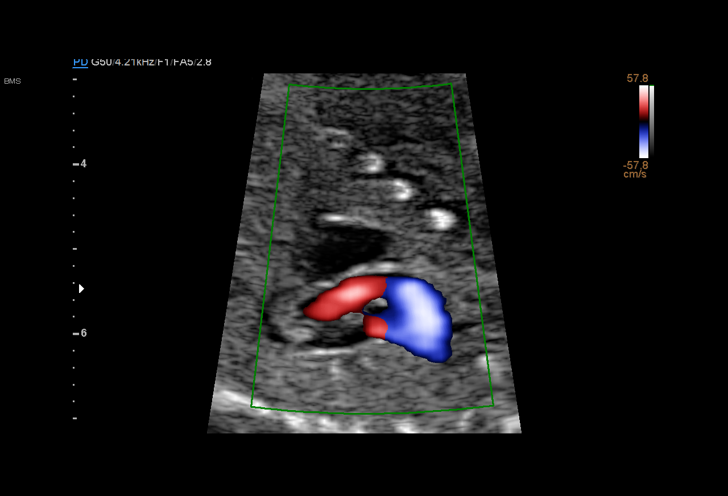
[im 46/113]
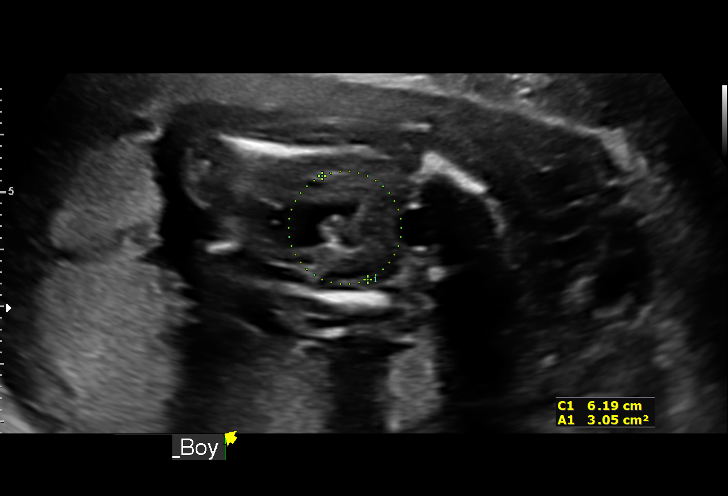
[im 59/113]
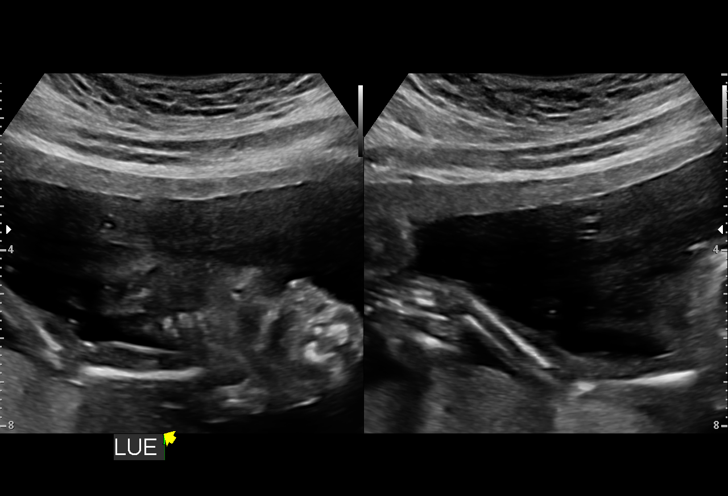
[im 67/113]
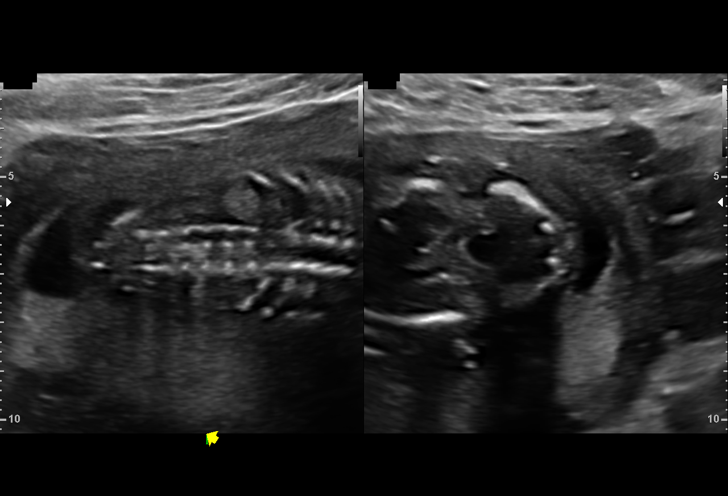
[im 75/113]
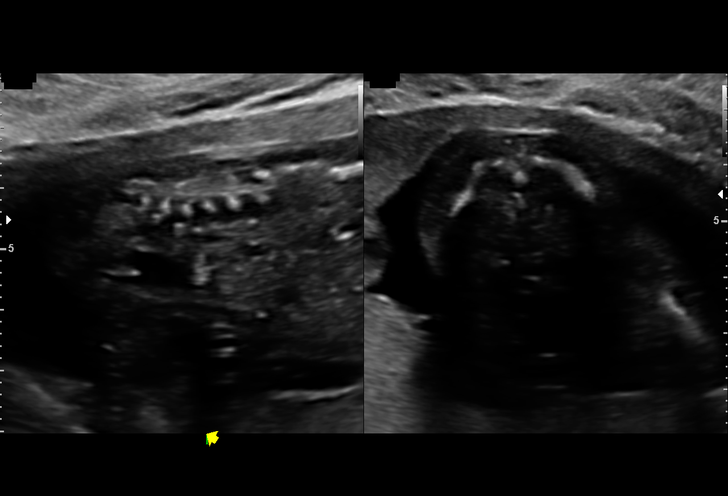
[im 83/113]
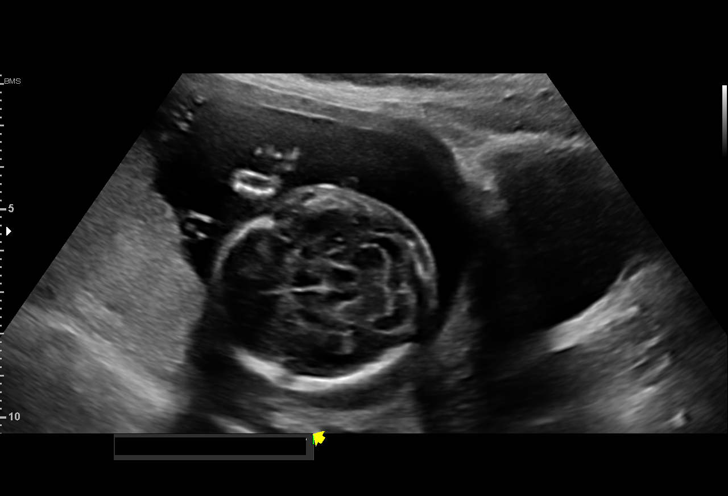
[im 92/113]
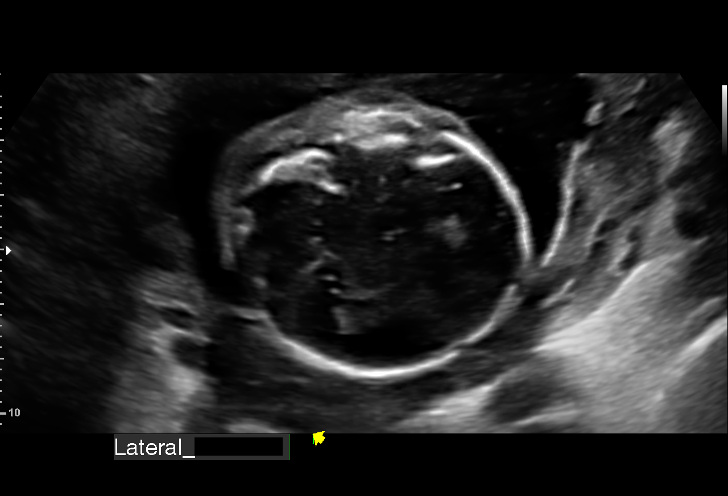
[im 100/113]
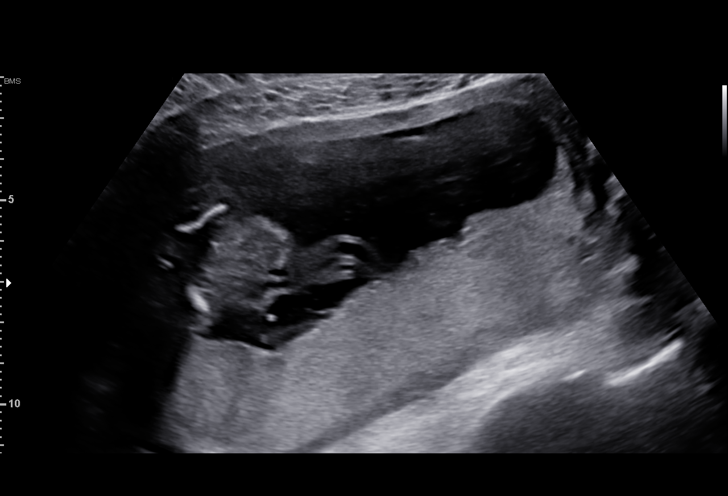
[im 108/113]
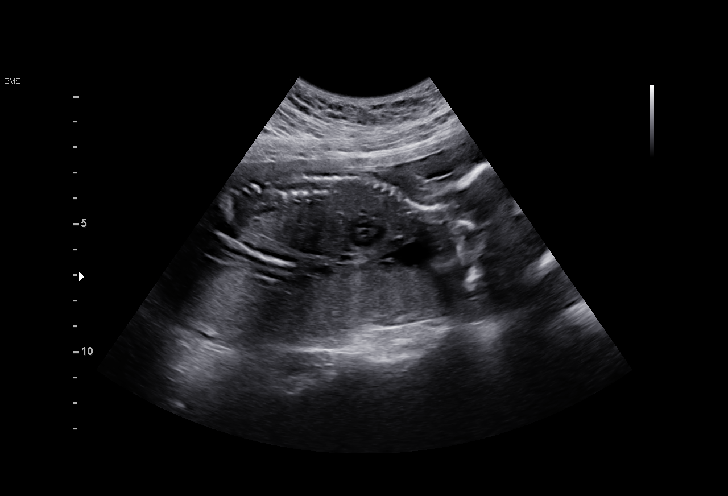

[13 of 28 positions shown; findings below may reference images not displayed]

[REDACTED]

 1  US MFM OB COMP + 14 WK                76805.01    RY

Indications

 Tobacco use complicating pregnancy,
 second trimester (vaping)
 19 weeks gestation of pregnancy
 Encounter for antenatal screening for
 malformations (LR NIPS)
 Asthma (childhood)
Fetal Evaluation

 Num Of Fetuses:         1
 Fetal Heart Rate(bpm):  133
 Cardiac Activity:       Observed
 Presentation:           Cephalic
 Placenta:               Posterior Fundal
 P. Cord Insertion:      Visualized, central

 Amniotic Fluid
 AFI FV:      Within normal limits

                             Largest Pocket(cm)

Biometry

 BPD:      45.6  mm     G. Age:  19w 5d         81  %    CI:        81.63   %    70 - 86
                                                         FL/HC:      16.4   %    16.1 -
 HC:      159.3  mm     G. Age:  18w 5d         31  %    HC/AC:      1.17        1.09 -
 AC:      135.6  mm     G. Age:  19w 0d         46  %    FL/BPD:     57.5   %
 FL:       26.2  mm     G. Age:  18w 0d         12  %    FL/AC:      19.3   %    20 - 24
 HUM:      27.3  mm     G. Age:  18w 5d         42  %
 CER:        19  mm     G. Age:  18w 4d         16  %
 NFT:       3.3  mm

 LV:        7.3  mm
 CM:        3.8  mm

 Est. FW:     247  gm      0 lb 9 oz     23  %
OB History

 Gravidity:    0
 Living:       0
Gestational Age

 LMP:           19w 0d        Date:  02/13/20                 EDD:   11/19/20
 U/S Today:     18w 6d                                        EDD:   11/20/20
 Best:          19w 0d     Det. By:  LMP  (02/13/20)          EDD:   11/19/20
Anatomy

 Cranium:               Appears normal         Aortic Arch:            Appears normal
 Cavum:                 Appears normal         Ductal Arch:            Not well visualized
 Ventricles:            Appears normal         Diaphragm:              Appears normal
 Choroid Plexus:        Appears normal         Stomach:                Appears normal, left
                                                                       sided
 Cerebellum:            Appears normal         Abdomen:                Appears normal
 Posterior Fossa:       Appears normal         Abdominal Wall:         Appears nml (cord
                                                                       insert, abd wall)
 Nuchal Fold:           Appears normal         Cord Vessels:           Appears normal (3
                                                                       vessel cord)
 Face:                  Appears normal         Kidneys:                Appear normal
                        (orbits and profile)
 Lips:                  Not well visualized    Bladder:                Appears normal
 Thoracic:              Appears normal         Spine:                  Appears normal
 Heart:                 Not well visualized    Upper Extremities:      Appears normal
 RVOT:                  Not well visualized    Lower Extremities:      Appears normal
 LVOT:                  Not well visualized

 Other:  Hands and feet visualized. Open hands visualized. Nasal bone
         visualized. Fetus appears to be a male. Technically difficult due to
         fetal position.
Cervix Uterus Adnexa

 Cervix
 Length:            3.1  cm.
 Normal appearance by transabdominal scan.

 Uterus
 No abnormality visualized.

 Right Ovary
 Within normal limits.
 Left Ovary
 Not visualized.

 Cul De Sac
 No free fluid seen.

 Adnexa
 No adnexal mass visualized.
Comments

 This patient was seen for a detailed fetal anatomy scan.
 She denies any significant past medical history and denies
 any problems in her current pregnancy.
 She had a cell free DNA test earlier in her pregnancy which
 indicated a low risk for trisomy 21, 18, and 13. A male fetus is
 predicted.
 She was informed that the fetal growth and amniotic fluid
 level were appropriate for her gestational age.
 There were no obvious fetal anomalies noted on today's
 ultrasound exam.  However, the views of the fetal anatomy
 were limited today due to the fetal position.
 The patient was informed that anomalies may be missed due
 to technical limitations. If the fetus is in a suboptimal position
 or maternal habitus is increased, visualization of the fetus in
 the maternal uterus may be impaired.
 A follow-up exam was scheduled in 4 weeks to complete the
 views of the fetal anatomy.
